# Patient Record
Sex: Female | Born: 1960 | Race: White | Hispanic: No | Marital: Married | Smoking: Never smoker
Health system: Southern US, Community
[De-identification: ages and names within clinical notes are randomized; demographics above are authoritative.]

## PROBLEM LIST (undated history)

## (undated) HISTORY — PX: BREAST EXCISIONAL BIOPSY: SUR124

---
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
## 2005-05-14 ENCOUNTER — Ambulatory Visit: Payer: Self-pay

## 2006-01-13 ENCOUNTER — Ambulatory Visit: Payer: Self-pay

## 2006-05-28 ENCOUNTER — Ambulatory Visit: Payer: Self-pay

## 2007-07-23 ENCOUNTER — Ambulatory Visit: Payer: Self-pay

## 2008-07-26 ENCOUNTER — Ambulatory Visit: Payer: Self-pay | Admitting: Unknown Physician Specialty

## 2009-04-20 ENCOUNTER — Ambulatory Visit: Payer: Self-pay

## 2009-08-08 ENCOUNTER — Ambulatory Visit: Payer: Self-pay | Admitting: Unknown Physician Specialty

## 2010-08-16 ENCOUNTER — Ambulatory Visit: Payer: Self-pay | Admitting: Unknown Physician Specialty

## 2010-08-23 ENCOUNTER — Ambulatory Visit: Payer: Self-pay | Admitting: Unknown Physician Specialty

## 2011-02-20 ENCOUNTER — Ambulatory Visit: Payer: Self-pay | Admitting: Unknown Physician Specialty

## 2011-03-13 DIAGNOSIS — I10 Essential (primary) hypertension: Secondary | ICD-10-CM | POA: Insufficient documentation

## 2011-09-10 ENCOUNTER — Ambulatory Visit: Payer: Self-pay

## 2015-05-22 DIAGNOSIS — Z1211 Encounter for screening for malignant neoplasm of colon: Secondary | ICD-10-CM | POA: Insufficient documentation

## 2016-10-21 ENCOUNTER — Other Ambulatory Visit: Payer: Self-pay | Admitting: Family Medicine

## 2016-10-21 DIAGNOSIS — Z1231 Encounter for screening mammogram for malignant neoplasm of breast: Secondary | ICD-10-CM

## 2016-10-23 ENCOUNTER — Ambulatory Visit
Admission: RE | Admit: 2016-10-23 | Discharge: 2016-10-23 | Disposition: A | Discharge status: Home / Self Care | Payer: BC Managed Care – PPO | Source: Ambulatory Visit | Attending: Family Medicine | Admitting: Family Medicine

## 2016-10-23 DIAGNOSIS — R928 Other abnormal and inconclusive findings on diagnostic imaging of breast: Secondary | ICD-10-CM | POA: Insufficient documentation

## 2016-10-23 DIAGNOSIS — Z1231 Encounter for screening mammogram for malignant neoplasm of breast: Secondary | ICD-10-CM | POA: Insufficient documentation

## 2016-10-23 DIAGNOSIS — N6489 Other specified disorders of breast: Secondary | ICD-10-CM | POA: Diagnosis not present

## 2016-10-25 ENCOUNTER — Other Ambulatory Visit: Payer: Self-pay | Admitting: Family Medicine

## 2016-10-25 DIAGNOSIS — R928 Other abnormal and inconclusive findings on diagnostic imaging of breast: Secondary | ICD-10-CM

## 2016-10-25 DIAGNOSIS — N6489 Other specified disorders of breast: Secondary | ICD-10-CM

## 2016-11-01 ENCOUNTER — Other Ambulatory Visit: Payer: Self-pay | Admitting: *Deleted

## 2016-11-01 ENCOUNTER — Ambulatory Visit
Admission: RE | Admit: 2016-11-01 | Discharge: 2016-11-01 | Disposition: A | Discharge status: Home / Self Care | Payer: BC Managed Care – PPO | Source: Ambulatory Visit | Attending: Family Medicine | Admitting: Family Medicine

## 2016-11-01 ENCOUNTER — Inpatient Hospital Stay
Admission: RE | Admit: 2016-11-01 | Discharge: 2016-11-01 | Disposition: A | Discharge status: Home / Self Care | Payer: Self-pay | Source: Ambulatory Visit | Attending: *Deleted | Admitting: *Deleted

## 2016-11-01 DIAGNOSIS — Z9289 Personal history of other medical treatment: Secondary | ICD-10-CM

## 2016-11-01 DIAGNOSIS — N6489 Other specified disorders of breast: Secondary | ICD-10-CM

## 2016-11-01 DIAGNOSIS — R928 Other abnormal and inconclusive findings on diagnostic imaging of breast: Secondary | ICD-10-CM

## 2016-11-04 ENCOUNTER — Other Ambulatory Visit: Payer: Self-pay | Admitting: *Deleted

## 2016-11-04 ENCOUNTER — Inpatient Hospital Stay
Admission: RE | Admit: 2016-11-04 | Discharge: 2016-11-04 | Disposition: A | Discharge status: Home / Self Care | Payer: Self-pay | Source: Ambulatory Visit | Attending: *Deleted | Admitting: *Deleted

## 2016-11-04 DIAGNOSIS — Z1231 Encounter for screening mammogram for malignant neoplasm of breast: Secondary | ICD-10-CM

## 2017-05-21 ENCOUNTER — Other Ambulatory Visit: Payer: Self-pay | Admitting: Family Medicine

## 2017-05-21 DIAGNOSIS — N6310 Unspecified lump in the right breast, unspecified quadrant: Secondary | ICD-10-CM

## 2017-06-26 ENCOUNTER — Ambulatory Visit
Admission: RE | Admit: 2017-06-26 | Discharge: 2017-06-26 | Disposition: A | Discharge status: Home / Self Care | Payer: BC Managed Care – PPO | Source: Ambulatory Visit | Attending: Family Medicine | Admitting: Family Medicine

## 2017-06-26 DIAGNOSIS — N6001 Solitary cyst of right breast: Secondary | ICD-10-CM | POA: Diagnosis not present

## 2017-06-26 DIAGNOSIS — N6310 Unspecified lump in the right breast, unspecified quadrant: Secondary | ICD-10-CM

## 2017-07-11 ENCOUNTER — Encounter: Payer: Self-pay | Admitting: Adult Health

## 2017-07-11 ENCOUNTER — Ambulatory Visit: Payer: Self-pay | Admitting: Adult Health

## 2017-07-11 ENCOUNTER — Encounter: Payer: Self-pay | Admitting: Registered Nurse

## 2017-07-11 VITALS — BP 144/88 | HR 72 | Temp 98.9°F | Resp 16 | Ht 64.0 in | Wt 165.0 lb

## 2017-07-11 DIAGNOSIS — R319 Hematuria, unspecified: Principal | ICD-10-CM

## 2017-07-11 DIAGNOSIS — N390 Urinary tract infection, site not specified: Secondary | ICD-10-CM

## 2017-07-11 LAB — POCT URINALYSIS DIPSTICK
BILIRUBIN UA: NEGATIVE
Blood, UA: NEGATIVE
Glucose, UA: NEGATIVE
Ketones, UA: NEGATIVE
Nitrite, UA: NEGATIVE
Spec Grav, UA: 1.015 (ref 1.010–1.025)
UROBILINOGEN UA: 0.2 U/dL
pH, UA: 6.5 (ref 5.0–8.0)

## 2017-07-11 MED ORDER — ACETAMINOPHEN 500 MG PO TABS: 1000.0000 mg | ORAL_TABLET | Freq: Four times a day (QID) | ORAL | 0 refills | Status: DC | PRN

## 2017-07-11 MED ORDER — IBUPROFEN 800 MG PO TABS: 800.0000 mg | ORAL_TABLET | Freq: Three times a day (TID) | ORAL | 0 refills | Status: DC

## 2017-07-11 MED ORDER — PHENAZOPYRIDINE HCL 97.5 MG PO TABS: 1.0000 | ORAL_TABLET | Freq: Three times a day (TID) | ORAL | Status: AC | PRN

## 2017-07-11 MED ORDER — AMOXICILLIN-POT CLAVULANATE 875-125 MG PO TABS: 1.0000 | ORAL_TABLET | Freq: Two times a day (BID) | ORAL | 0 refills | Status: DC

## 2017-07-11 NOTE — Progress Notes (Signed)
Subjective:    Patient ID: Regina Jimenez, female    DOB: [birth year], 57 y.o.   MRN: [id number]  56y/o caucasian female established patient here for left flank pain worse when wake up in morning stiff and after work in evening sits at desk works in [employer]ing.  Getting off floor difficulty due to pain as was putting on her leather boots today.  Taking motrin 800mg  po prn pain.  Babysat grandchildren 215lb 7718 month old and 4745lb 56 year old granddaughters.  Pain started after new years day.  Hasn't used flexeril in the past.  Has massage scheduled for tomorrow.  Heat, wore sons motorcyle kidney belt and helped some but didn't resolve.  Motrin just taking the edge off but wears off taking BID typically.  Thinks she got [facility] or from [facility] for UTI.  Has azo at home but not using typically she has low suprapubic symptoms/burning.  If she eats a lot of fruit that is when she typically gets UTI.  Stopped putting lemon in her water this week and only drinking plain water.  Denied holiday travel/restricting fluids.  Noted hand/finger swelling past couple days can't get rings on/off       Review of Systems  Constitutional: Negative for activity change, appetite change, chills, diaphoresis, fatigue, fever and unexpected weight change.  HENT: Negative for sore throat, trouble swallowing and voice change.   Eyes: Negative for photophobia and visual disturbance.  Respiratory: Negative for cough.   Cardiovascular: Negative for leg swelling.  Gastrointestinal: Positive for constipation. Negative for abdominal distention, abdominal pain, anal bleeding, blood in stool, diarrhea, nausea, rectal pain and vomiting.  Endocrine: Negative for cold intolerance and heat intolerance.  Genitourinary: Positive for flank pain. Negative for decreased urine volume, difficulty urinating, dysuria, enuresis, frequency, genital sores, hematuria, menstrual problem, pelvic pain, urgency, vaginal  bleeding, vaginal discharge and vaginal pain.  Musculoskeletal: Positive for back pain, joint swelling and myalgias. Negative for arthralgias, gait problem, neck pain and neck stiffness.  Skin: Negative for color change, pallor, rash and wound.  Allergic/Immunologic: Negative for food allergies.  Neurological: Negative for dizziness, tremors, seizures, syncope, facial asymmetry, speech difficulty, weakness, light-headedness, numbness and headaches.  Hematological: Negative for adenopathy. Does not bruise/bleed easily.  Psychiatric/Behavioral: Positive for sleep disturbance. Negative for agitation and confusion.       Objective:   Physical Exam  Constitutional: She is oriented to person, place, and time. Vital signs are normal. She appears well-developed and well-nourished. She is active and cooperative.  Non-toxic appearance. She does not have a sickly appearance. She does not appear ill. No distress.  HENT:  Head: Normocephalic and atraumatic.  Right Ear: Hearing and external ear normal.  Left Ear: Hearing and external ear normal.  Nose: Nose normal.  Mouth/Throat: Uvula is midline, oropharynx is clear and moist and mucous membranes are normal. Mucous membranes are not pale, not dry and not cyanotic. No oropharyngeal exudate, posterior oropharyngeal edema, posterior oropharyngeal erythema or tonsillar abscesses.  Eyes: Conjunctivae, EOM and lids are normal. Pupils are equal, round, and reactive to light. Right eye exhibits no discharge. Left eye exhibits no discharge. No scleral icterus.  Neck: Trachea normal, normal range of motion and phonation normal. Neck supple. No muscular tenderness present. No neck rigidity. No tracheal deviation, no edema, no erythema and normal range of motion present.  Cardiovascular: Normal rate, regular rhythm, normal heart sounds and intact distal pulses. Exam reveals no gallop and no friction rub.  No murmur heard. Pulses:      Radial pulses are 2+ on the  right side, and 2+ on the left side.  Pulmonary/Chest: Effort normal and breath sounds normal. No accessory muscle usage or stridor. No respiratory distress. She has no decreased breath sounds. She has no wheezes. She has no rhonchi. She has no rales. She exhibits no tenderness.  Spoke full sentences without difficulty; no cough observed in exam room  Abdominal: Soft. Normal appearance. She exhibits no shifting dullness, no distension, no pulsatile liver, no fluid wave, no abdominal bruit, no ascites, no pulsatile midline mass and no mass. Bowel sounds are decreased. There is no tenderness. There is no rigidity, no rebound, no guarding, no CVA tenderness, no tenderness at McBurney's point and negative Murphy's sign. Hernia confirmed negative in the ventral area.  Dull to percussion x 4 quads; hypoactive bowel sounds x 4 quads; negative bilateral CVA tenderness  Musculoskeletal: She exhibits no edema, tenderness or deformity.       Right shoulder: Normal.       Left shoulder: Normal.       Right elbow: Normal.      Left elbow: Normal.       Right wrist: Normal.       Left wrist: Normal.       Right hip: Normal.       Left hip: Normal.       Right knee: Normal.       Left knee: Normal.       Cervical back: Normal.       Thoracic back: She exhibits decreased range of motion and pain. She exhibits no tenderness, no bony tenderness, no swelling, no edema, no deformity, no laceration, no spasm and normal pulse.       Lumbar back: Normal.       Right forearm: Normal.       Left forearm: Normal.       Right hand: Normal.       Left hand: Normal.  Able to touch mid shins left flank pain with left rotation/lateral bending/forward flexion greater than 45 degrees worst with straightening up; normal heel/toe gait; requested assist to sit up from lying position on exam table; not TTP paraspinals; full arom c-spine; denied worsening pain with t/l/c-spine extension; wearing heeled 2 inch boots leather knee  high  Lymphadenopathy:       Head (right side): No submental, no submandibular, no tonsillar, no preauricular, no posterior auricular and no occipital adenopathy present.       Head (left side): No submental, no submandibular, no tonsillar, no preauricular, no posterior auricular and no occipital adenopathy present.    She has no cervical adenopathy.       Right cervical: No superficial cervical, no deep cervical and no posterior cervical adenopathy present.      Left cervical: No superficial cervical, no deep cervical and no posterior cervical adenopathy present.  Neurological: She is alert and oriented to person, place, and time. She has normal strength. She is not disoriented. She displays no atrophy, no tremor and normal reflexes. No cranial nerve deficit or sensory deficit. She exhibits normal muscle tone. She displays no seizure activity. Coordination and gait normal. GCS eye subscore is 4. GCS verbal subscore is 5. GCS motor subscore is 6.  Reflex Scores:      Patellar reflexes are 2+ on the right side and 2+ on the left side. On/off exam table without difficulty; gait sure and steady in hallway  Skin: Skin is warm, dry and intact. No rash noted. She is not diaphoretic. No erythema. No pallor.  Psychiatric: She has a normal mood and affect. Her speech is normal and behavior is normal. Judgment and thought content normal. Cognition and memory are normal.  Nursing note and vitals reviewed.   Discussed dipstick results with patient in clinic prior to ambulatory discharge + leu.      Assessment & Plan:  A-UTI  P-patient has received augmentin previously from PA Ratcliffe on chart review medicat 2018 and 2017.  Rx for 2 week course electronic given to patient electronically.  May stop after 1 week of 875mg  po BID #28 RF0 if symptoms resolved for at least 48 hours if not complete 2 week course if worsening after 48 hours on antibiotics e.g. Gross hematuria, unable to void every 8 hours,  worsening back pain, fever, cloudy/smelly urine follow up for re-evaluation.  May have massage tomorrow.  Motrin 800mg  po TId prn pain.  Azo OTC 100-200mg  po TID x 2 days.  Discussed will discolor urine to cold and could discolor clothing if urine drips.  Hydrate to keep urine pale clear yellow/void every 4-6 hours.  Repeat urinalysis and send culture if no improvement or worsening with plan of care.  Exitcare handout on pyelonephritis/uti given to patient.  Patient denied need for work note.  Patient verbalized understanding information/instructions, agreed with plan of care and had no further questions at this time.  Take with food OTC Ibuprofen 800mg  po TID prn pain.  Home stretches. Self massage or professional prn, foam roller use or tennis/racquetball.  Heat/cryotherapy 15 minutes QID prn.  Trial thermacare 1 applied and another given to patient for use tomorrow from clinic stock.  Consider physical therapy referral if no improvement with prescribed therapy from [facility] and/or chiropractic care.  Ensure ergonomics correct desk at work avoid repetitive motions if possible/holding phone/laptop in hand use desk/stand and/or break up lifting items into smaller loads/weights.  Patient was instructed to rest, ice, and ROM exercises.  Activity as tolerated.   Follow up if symptoms persist or worsen especially if loss of bowel/bladder control, arm/leg weakness and/or saddle paresthesias. Patient verbalized agreement and understanding of treatment plan and had no further questions at this time.  P2:  In[employer].

## 2017-07-11 NOTE — Addendum Note (Signed)
Addended by: Jethro BolusGREENE, Dontavion Noxon P on: 07/11/2017 03:26 PM   Modules accepted: Orders

## 2017-07-11 NOTE — Addendum Note (Signed)
Addended by: Florina OuHARRISON, Kaoru Rezendes J on: 07/11/2017 02:54 PM   Modules accepted: Orders

## 2017-07-11 NOTE — Patient Instructions (Signed)

## 2017-07-14 ENCOUNTER — Encounter: Payer: Self-pay | Admitting: Medical

## 2017-07-14 ENCOUNTER — Ambulatory Visit: Payer: Self-pay | Admitting: Medical

## 2017-07-14 VITALS — BP 110/80 | HR 80 | Temp 98.6°F | Resp 18 | Wt 164.0 lb

## 2017-07-14 DIAGNOSIS — N390 Urinary tract infection, site not specified: Secondary | ICD-10-CM

## 2017-07-14 DIAGNOSIS — R109 Unspecified abdominal pain: Secondary | ICD-10-CM

## 2017-07-14 LAB — POCT URINALYSIS DIPSTICK
BILIRUBIN UA: NEGATIVE
GLUCOSE UA: NEGATIVE
Leukocytes, UA: NEGATIVE
Nitrite, UA: NEGATIVE
PH UA: 6 (ref 5.0–8.0)
Spec Grav, UA: 1.02 (ref 1.010–1.025)
UROBILINOGEN UA: 0.2 U/dL

## 2017-07-14 MED ORDER — CIPROFLOXACIN HCL 500 MG PO TABS: 500.0000 mg | ORAL_TABLET | Freq: Two times a day (BID) | ORAL | 0 refills | Status: DC

## 2017-07-14 NOTE — Patient Instructions (Signed)
pyCiprofloxacin tablets What is this medicine? CIPROFLOXACIN (sip roe FLOX a sin) is a quinolone antibiotic. It is used to treat certain kinds of bacterial infections. It will not work for colds, flu, or other viral infections. This medicine may be used for other purposes; ask your health care provider or pharmacist if you have questions. COMMON BRAND NAME(S): Cipro What should I tell my health care provider before I take this medicine? They need to know if you have any of these conditions: -bone problems -history of low levels of potassium in the blood -joint problems -irregular heartbeat -kidney disease -myasthenia gravis -seizures -tendon problems -tingling of the fingers or toes, or other nerve disorder -an unusual or allergic reaction to ciprofloxacin, other antibiotics or medicines, foods, dyes, or preservatives -pregnant or trying to get pregnant -breast-feeding How should I use this medicine? Take this medicine by mouth with a glass of water. Follow the directions on the prescription label. Take your medicine at regular intervals. Do not take your medicine more often than directed. Take all of your medicine as directed even if you think your are better. Do not skip doses or stop your medicine early. You can take this medicine with food or on an empty stomach. It can be taken with a meal that contains dairy or calcium, but do not take it alone with a dairy product, like milk or yogurt or calcium-fortified juice. A special MedGuide will be given to you by the pharmacist with each prescription and refill. Be sure to read this information carefully each time. Talk to your pediatrician regarding the use of this medicine in children. Special care may be needed. Overdosage: If you think you have taken too much of this medicine contact a poison control center or emergency room at once. NOTE: This medicine is only for you. Do not share this medicine with others. What if I miss a dose? If  you miss a dose, take it as soon as you can. If it is almost time for your next dose, take only that dose. Do not take double or extra doses. What may interact with this medicine? Do not take this medicine with any of the following medications: -cisapride -dofetilide -dronedarone -flibanserin -lomitapide -pimozide -thioridazine -tizanidine -ziprasidone This medicine may also interact with the following medications: -antacids -birth control pills -caffeine -certain medicines for diabetes, like glipizide or glyburide -certain medicines that treat or prevent blood clots like warfarin -clozapine -cyclosporine -didanosine (ddI) buffered tablets or powder -duloxetine -lanthanum carbonate -lidocaine -methotrexate -multivitamins -NSAIDS, medicines for pain and inflammation, like ibuprofen or naproxen -olanzapine -omeprazole -other medicines that prolong the QT interval (cause an abnormal heart rhythm) -phenytoin -probenecid -ropinirole -sevelamer -sildenafil -sucralfate -theophylline -zolpidem This list may not describe all possible interactions. Give your health care provider a list of all the medicines, herbs, non-prescription drugs, or dietary supplements you use. Also tell them if you smoke, drink alcohol, or use illegal drugs. Some items may interact with your medicine. What should I watch for while using this medicine? Tell your doctor or health care professional if your symptoms do not improve. Do not treat diarrhea with over the counter products. Contact your doctor if you have diarrhea that lasts more than 2 days or if it is severe and watery. You may get drowsy or dizzy. Do not drive, use machinery, or do anything that needs mental alertness until you know how this medicine affects you. Do not stand or sit up quickly, especially if you are an older patient. This reduces  the risk of dizzy or fainting spells. This medicine can make you more sensitive to the sun. Keep out of  the sun. If you cannot avoid being in the sun, wear protective clothing and use sunscreen. Do not use sun lamps or tanning beds/booths. Avoid antacids, aluminum, calcium, iron, magnesium, and zinc products for 6 hours before and 2 hours after taking a dose of this medicine. What side effects may I notice from receiving this medicine? Side effects that you should report to your doctor or health care professional as soon as possible: -allergic reactions like skin rash or hives, swelling of the face, lips, or tongue -anxious -confusion -depressed mood -diarrhea -fast, irregular heartbeat -hallucination, loss of contact with reality -joint, muscle, or tendon pain or swelling -pain, tingling, numbness in the hands or feet -suicidal thoughts or other mood changes -sunburn -unusually weak or tired Side effects that usually do not require medical attention (report to your doctor or health care professional if they continue or are bothersome): -dry mouth -headache -nausea -trouble sleeping This list may not describe all possible side effects. Call your doctor for medical advice about side effects. You may report side effects to FDA at 1-800-FDA-1088. Where should I keep my medicine? Keep out of the reach of children. Store at room temperature below 30 degrees C (86 degrees F). Keep container tightly closed. Throw away any unused medicine after the expiration date. NOTE: This sheet is a summary. It may not cover all possible information. If you have questions about this medicine, talk to your doctor, pharmacist, or health care provider.  2018 Elsevier/Gold Standard (2016-02-02 14:42:02)  Pyelonephritis, Adult Pyelonephritis is a kidney infection. The kidneys are organs that help clean your blood by moving waste out of your blood and into your pee (urine). This infection can happen quickly, or it can last for a long time. In most cases, it clears up with treatment and does not cause other  problems. Follow these instructions at home: Medicines  Take over-the-counter and prescription medicines only as told by your doctor.  Take your antibiotic medicine as told by your doctor. Do not stop taking the medicine even if you start to feel better. General instructions  Drink enough fluid to keep your pee clear or pale yellow.  Avoid caffeine, tea, and carbonated drinks.  Pee (urinate) often. Avoid holding in pee for long periods of time.  Pee before and after sex.  After pooping (having a bowel movement), women should wipe from front to back. Use each tissue only once.  Keep all follow-up visits as told by your doctor. This is important. Contact a doctor if:  You do not feel better after 2 days.  Your symptoms get worse.  You have a fever. Get help right away if:  You cannot take your medicine or drink fluids as told.  You have chills and shaking.  You throw up (vomit).  You have very bad pain in your side (flank) or back.  You feel very weak or you pass out (faint). This information is not intended to replace advice given to you by your health care provider. Make sure you discuss any questions you have with your health care provider. Document Released: 08/01/2004 Document Revised: 11/30/2015 Document Reviewed: 10/17/2014 Elsevier Interactive Patient Education  [employer]2018 Elsevier Inc.

## 2017-07-14 NOTE — Progress Notes (Signed)
   Subjective:    Patient ID: Regina Jimenez, female    DOB: [birth year], 57 y.o.   MRN: [id number]  HPI 57 yo female in non acute distress today, with symptoms of  feeling bad after taking  Augmentin, took at 4 pm and then 11 pm and then 8 am the next morning and then felt bad. Decreased appetite. At  5:30 pm felt worse on Saturday.  Had  Left sided back pain, today back pain feels better. Taking medication with food and now  Every 12 hours.. Feels nauseated today. . No urgency no  Frequency or burning sensation with urination , had no  symptoms previously except left sided back pain. Woke up this morning  With body aches but took 2 tylenol this morning  and now feels better. Fever and chills on Saturday only.   Patient feels that previously when on Augmentin that she had felt bad taking medication.  Review of Systems  Constitutional: Positive for chills (Satuday night) and fever (Saturday night).  HENT: Positive for postnasal drip. Negative for congestion, ear pain and sore throat (scratchy).   Eyes: Negative for discharge and itching.  Respiratory: Positive for cough (non production).   Cardiovascular: Negative for chest pain.  Gastrointestinal: Positive for nausea. Negative for abdominal pain, constipation (now resolved) and vomiting.  Endocrine: Negative for polydipsia, polyphagia and polyuria.  Genitourinary: Positive for flank pain (left side). Negative for dysuria, frequency, hematuria and urgency.  Musculoskeletal: Positive for myalgias.  Skin: Negative for rash.  Allergic/Immunologic: Positive for environmental allergies. Negative for food allergies and immunocompromised state.  Neurological: Negative for dizziness, syncope and light-headedness.  Hematological: Negative for adenopathy.  Psychiatric/Behavioral: Negative for behavioral problems, self-injury and suicidal ideas. The patient is not nervous/anxious.        Objective:   Physical Exam  Constitutional: She is oriented  to person, place, and time. She appears well-developed and well-nourished.  HENT:  Head: Normocephalic and atraumatic.  Eyes: Conjunctivae and EOM are normal. Pupils are equal, round, and reactive to light.  Neck: Normal range of motion.  Neurological: She is alert and oriented to person, place, and time.  Skin: Skin is warm and dry. No rash noted.  Psychiatric: She has a normal mood and affect. Her behavior is normal. Judgment and thought content normal.  Nursing note and vitals reviewed.   No vertebral tenderness No CVA tenderness on palpation Skin wnl. Gait and movement wnl  Urine dip trace protein , trace blood, urine medium yelllow in color.    Assessment & Plan:  Left sided flank pain possibly kidney infection, given info on Pyelonephritis Will change to Cipro and send urine off for culture.Stop Augmentin. To increase water intake. To return to clinic in 3-5 days if not improving, sooner if worsening , Patient verbalizes understanding and has no questions at discharge.

## 2017-07-16 ENCOUNTER — Encounter: Payer: Self-pay | Admitting: *Deleted

## 2017-07-16 ENCOUNTER — Telehealth: Payer: Self-pay | Admitting: *Deleted

## 2017-07-16 LAB — URINE CULTURE: Organism ID, Bacteria: NO GROWTH

## 2017-07-16 NOTE — Telephone Encounter (Signed)
Telephone message left for patient urine culture normal/negative.  Continue cipro as prescribed by PA Ratcliffe and follow up if symptoms not resolving or worsening.  Noted patient was seen by PA Ratcliffe on 07/14/2017 after I had evaluated her on 07/11/2017 and started her on augmentin due to urinary symptoms not improving as expected.

## 2017-08-12 ENCOUNTER — Ambulatory Visit: Payer: Self-pay | Admitting: Medical

## 2017-08-12 ENCOUNTER — Encounter: Payer: Self-pay | Admitting: Medical

## 2017-08-12 VITALS — BP 126/86 | HR 80 | Temp 99.4°F | Resp 18 | Ht 64.0 in | Wt 163.2 lb

## 2017-08-12 DIAGNOSIS — J069 Acute upper respiratory infection, unspecified: Secondary | ICD-10-CM

## 2017-08-12 DIAGNOSIS — J0110 Acute frontal sinusitis, unspecified: Secondary | ICD-10-CM

## 2017-08-12 MED ORDER — AZITHROMYCIN 250 MG PO TABS: ORAL_TABLET | ORAL | 0 refills | Status: AC

## 2017-08-12 NOTE — Patient Instructions (Signed)
Upper Respiratory Infection, Adult Most upper respiratory infections (URIs) are caused by a virus. A URI affects the nose, throat, and upper air passages. The most common type of URI is often called "the common cold." Follow these instructions at home:  Take medicines only as told by your doctor.  Gargle warm saltwater or take cough drops to comfort your throat as told by your doctor.  Use a warm mist humidifier or inhale steam from a shower to increase air moisture. This may make it easier to breathe.  Drink enough fluid to keep your pee (urine) clear or pale yellow.  Eat soups and other clear broths.  Have a healthy diet.  Rest as needed.  Go back to work when your fever is gone or your doctor says it is okay. ? You may need to stay home longer to avoid giving your URI to others. ? You can also wear a face mask and wash your hands often to prevent spread of the virus.  Use your inhaler more if you have asthma.  Do not use any tobacco products, including cigarettes, chewing tobacco, or electronic cigarettes. If you need help quitting, ask your doctor. Contact a doctor if:  You are getting worse, not better.  Your symptoms are not helped by medicine.  You have chills.  You are getting more short of breath.  You have brown or red mucus.  You have yellow or brown discharge from your nose.  You have pain in your face, especially when you bend forward.  You have a fever.  You have puffy (swollen) neck glands.  You have pain while swallowing.  You have white areas in the back of your throat. Get help right away if:  You have very bad or constant: ? Headache. ? Ear pain. ? Pain in your forehead, behind your eyes, and over your cheekbones (sinus pain). ? Chest pain.  You have long-lasting (chronic) lung disease and any of the following: ? Wheezing. ? Long-lasting cough. ? Coughing up blood. ? A change in your usual mucus.  You have a stiff neck.  You have  changes in your: ? Vision. ? Hearing. ? Thinking. ? Mood. This information is not intended to replace advice given to you by your health care provider. Make sure you discuss any questions you have with your health care provider. Document Released: 12/11/2007 Document Revised: 02/25/2016 Document Reviewed: 09/29/2013 Elsevier Interactive Patient Education  2018 [employer] Sinusitis, Adult Sinusitis is soreness and inflammation of your sinuses. Sinuses are hollow spaces in the bones around your face. They are located:  Around your eyes.  In the middle of your forehead.  Behind your nose.  In your cheekbones.  Your sinuses and nasal passages are lined with a stringy fluid (mucus). Mucus normally drains out of your sinuses. When your nasal tissues get inflamed or swollen, the mucus can get trapped or blocked so air cannot flow through your sinuses. This lets bacteria, viruses, and funguses grow, and that leads to infection. Follow these instructions at home: Medicines  Take, use, or apply over-the-counter and prescription medicines only as told by your doctor. These may include nasal sprays.  If you were prescribed an antibiotic medicine, take it as told by your doctor. Do not stop taking the antibiotic even if you start to feel better. Hydrate and Humidify  Drink enough water to keep your pee (urine) clear or pale yellow.  Use a cool mist humidifier to keep the humidity level in your home above   50%.  Breathe in steam for 10-15 minutes, 3-4 times a day or as told by your doctor. You can do this in the bathroom while a hot shower is running.  Try not to spend time in cool or dry air. Rest  Rest as much as possible.  Sleep with your head raised (elevated).  Make sure to get enough sleep each night. General instructions  Put a warm, moist washcloth on your face 3-4 times a day or as told by your doctor. This will help with discomfort.  Wash your hands often with soap and  water. If there is no soap and water, use hand sanitizer.  Do not smoke. Avoid being around people who are smoking (secondhand smoke).  Keep all follow-up visits as told by your doctor. This is important. Contact a doctor if:  You have a fever.  Your symptoms get worse.  Your symptoms do not get better within 10 days. Get help right away if:  You have a very bad headache.  You cannot stop throwing up (vomiting).  You have pain or swelling around your face or eyes.  You have trouble seeing.  You feel confused.  Your neck is stiff.  You have trouble breathing. This information is not intended to replace advice given to you by your health care provider. Make sure you discuss any questions you have with your health care provider. Document Released: 12/11/2007 Document Revised: 02/18/2016 Document Reviewed: 04/19/2015 Elsevier Interactive Patient Education  2018 [employer]  

## 2017-08-12 NOTE — Progress Notes (Signed)
   Subjective:    Patient ID: Regina Jimenez, female    DOB: 1961/03/25, 57 y.o.   MRN: [id number]  HPI 57 yo female in non acute distress, started  2 weeks ago with runny nose and felt did not feel well x 2 days. Then nasal congestion, runny nose discharge not sure of color. Coughing yellow. Feels like head is in a barrel, denies fever or chills, last  2 days decreased hearing and headache forehead.  Took Tylenol 500mg  x 2  At 7 am which did help. M ucinex and Robitussin last night.  Review of Systems  Constitutional: Positive for fatigue and fever. Negative for chills.  HENT: Positive for congestion, ear pain, postnasal drip, rhinorrhea, sinus pressure, sinus pain and sore throat (scratchy, worse at night.). Negative for sneezing.   Eyes: Negative for discharge and itching.  Respiratory: Positive for cough. Negative for shortness of breath and wheezing.   Cardiovascular: Negative for chest pain.  Gastrointestinal: Negative for abdominal pain.  Genitourinary: Negative for dysuria.  Musculoskeletal: Negative for back pain and myalgias.  Skin: Negative for rash.  Allergic/Immunologic: Positive for environmental allergies. Negative for food allergies.  Neurological: Positive for headaches. Negative for dizziness, syncope and light-headedness (with blowing nose).  Hematological: Negative for adenopathy.  Psychiatric/Behavioral: Negative for behavioral problems, self-injury and suicidal ideas. The patient is not nervous/anxious.        Objective:   Physical Exam  Constitutional: She is oriented to person, place, and time. She appears well-developed and well-nourished.  HENT:  Head: Normocephalic and atraumatic.  Right Ear: Hearing, external ear and ear canal normal.  Left Ear: Hearing, external ear and ear canal normal.  Nose: Mucosal edema and rhinorrhea present.  Mouth/Throat: Uvula is midline and mucous membranes are normal. Posterior oropharyngeal edema (mild) present.  Eyes:  Conjunctivae and EOM are normal. Pupils are equal, round, and reactive to light.  Neck: Normal range of motion. Neck supple.  Cardiovascular: Normal rate, regular rhythm and normal heart sounds.  Pulmonary/Chest: No respiratory distress. She has no wheezes. She has no rales.  Musculoskeletal: Normal range of motion.  Neurological: She is alert and oriented to person, place, and time.  Skin: Skin is warm and dry.  Psychiatric: She has a normal mood and affect. Her behavior is normal. Judgment and thought content normal.  Nursing note and vitals reviewed.  Cerumen obscuring both TMs.   Assessment & Plan:  Sinusitis/ Upper Respiratory infection.  OTC Flonase and Debrox otic drops take as directed.   Meds ordered this encounter  Medications  . azithromycin (ZITHROMAX) 250 MG tablet    Sig: Take  2 tablets by mouth day one then one tablet days 2-5 take with food    Dispense:  6 tablet    Refill:  0   return to the clinic in  3 -5 days if not improving. Patient verbalizes understanding without questions at discharge.

## 2017-09-30 ENCOUNTER — Other Ambulatory Visit: Payer: Self-pay | Admitting: Family Medicine

## 2017-09-30 DIAGNOSIS — N6001 Solitary cyst of right breast: Secondary | ICD-10-CM

## 2017-09-30 DIAGNOSIS — Z1231 Encounter for screening mammogram for malignant neoplasm of breast: Secondary | ICD-10-CM

## 2017-10-27 ENCOUNTER — Other Ambulatory Visit: Payer: BC Managed Care – PPO

## 2017-11-05 ENCOUNTER — Other Ambulatory Visit: Payer: BC Managed Care – PPO

## 2017-11-06 ENCOUNTER — Ambulatory Visit
Admission: RE | Admit: 2017-11-06 | Discharge: 2017-11-06 | Disposition: A | Discharge status: Home / Self Care | Payer: BC Managed Care – PPO | Source: Ambulatory Visit | Attending: Family Medicine | Admitting: Family Medicine

## 2017-11-06 DIAGNOSIS — Z1231 Encounter for screening mammogram for malignant neoplasm of breast: Secondary | ICD-10-CM

## 2017-11-06 DIAGNOSIS — N6001 Solitary cyst of right breast: Secondary | ICD-10-CM

## 2017-11-06 DIAGNOSIS — R928 Other abnormal and inconclusive findings on diagnostic imaging of breast: Secondary | ICD-10-CM | POA: Diagnosis not present

## 2018-04-28 IMAGING — MG MM DIGITAL SCREENING BILAT W/ TOMO W/ CAD
8 of 17 series · 8 of 33 positions shown · non-contrast
Comparison: Previous exam(s).

CLINICAL DATA: Screening.

EXAM:
2D DIGITAL SCREENING BILATERAL MAMMOGRAM WITH CAD AND ADJUNCT TOMO

[L MLO (1 of 2)]
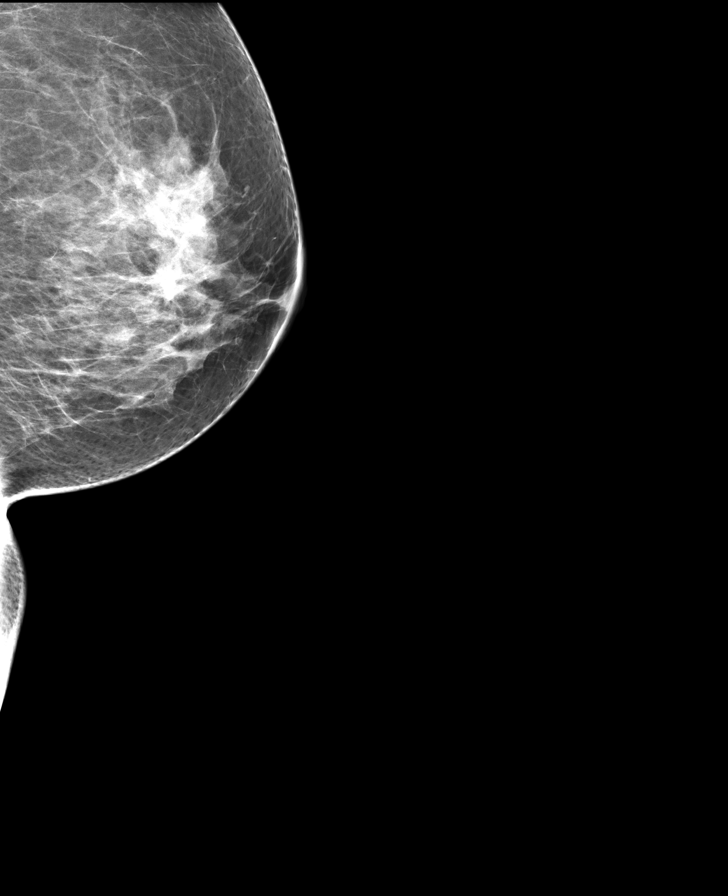

[R MLO synth-2D]
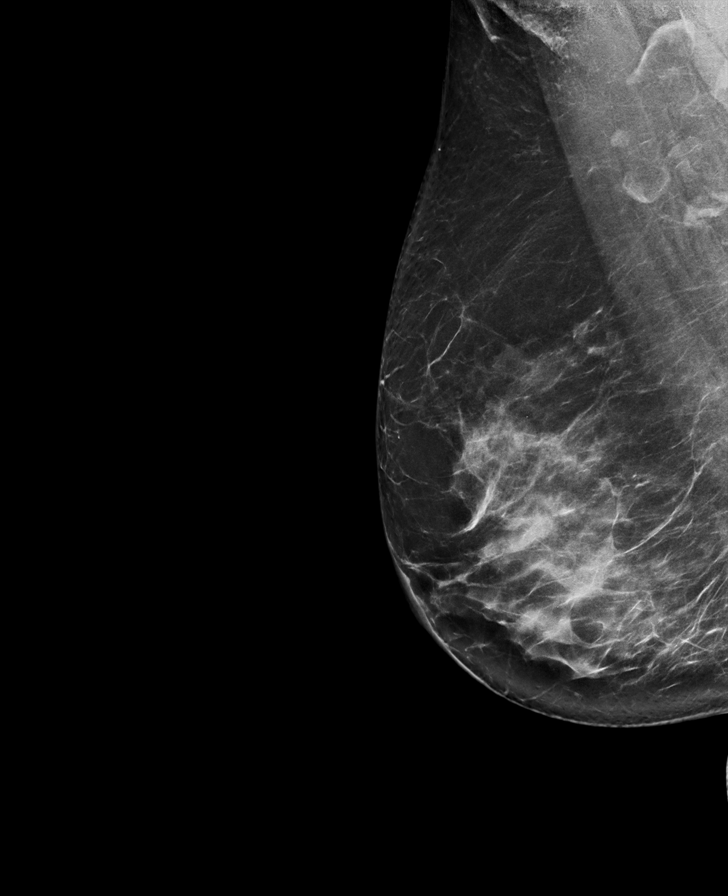

[L CC synth-2D]
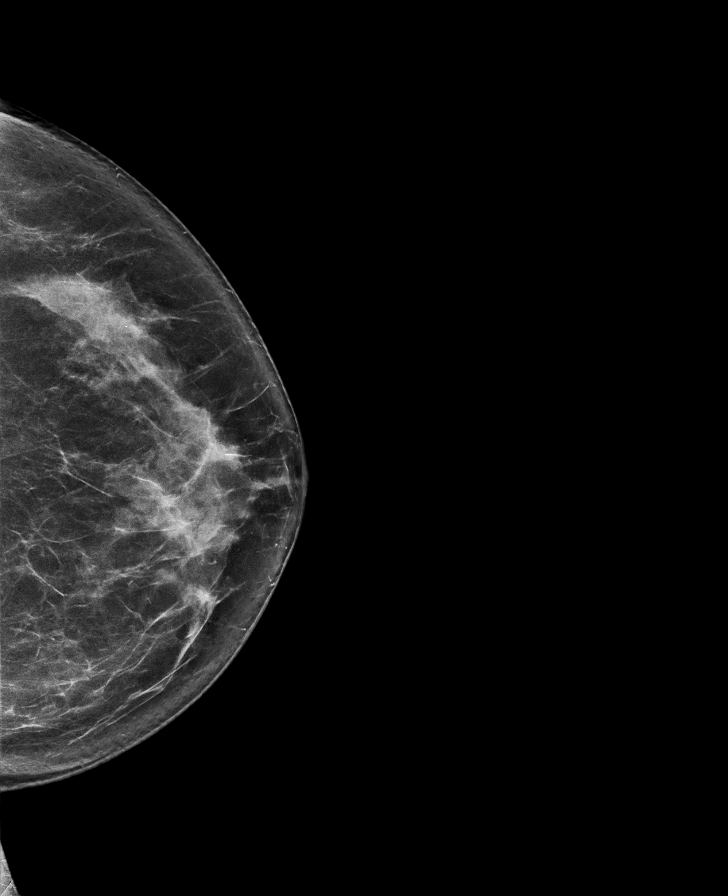

[L CC]
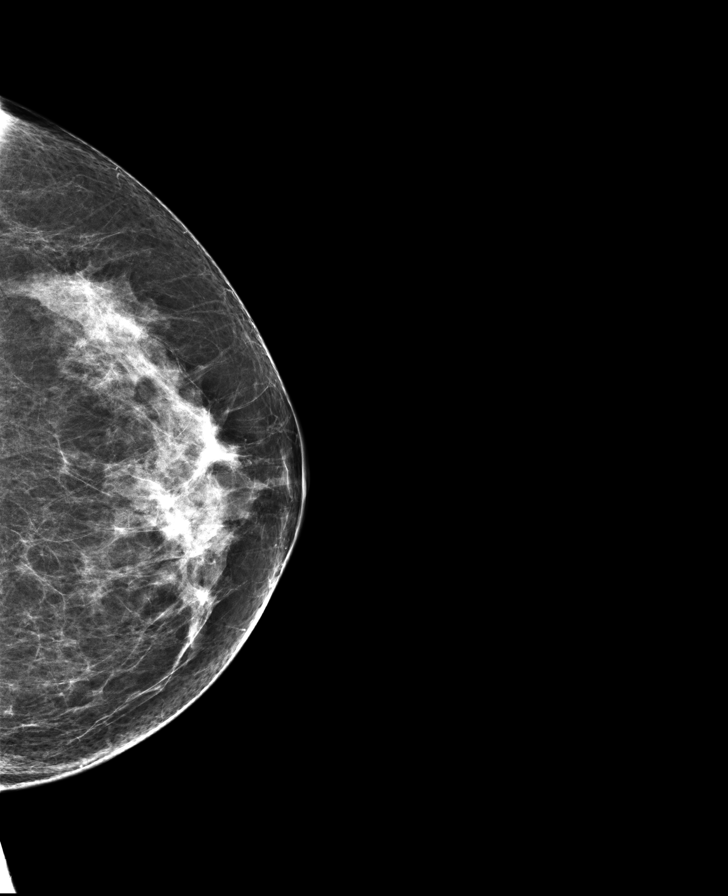

[R CC]
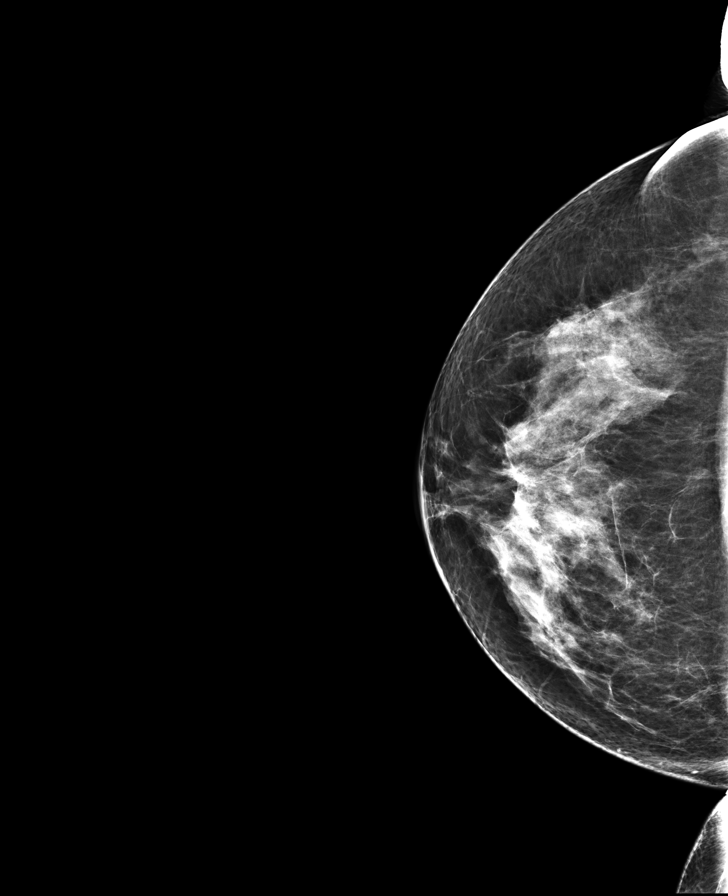

[R CC synth-2D]
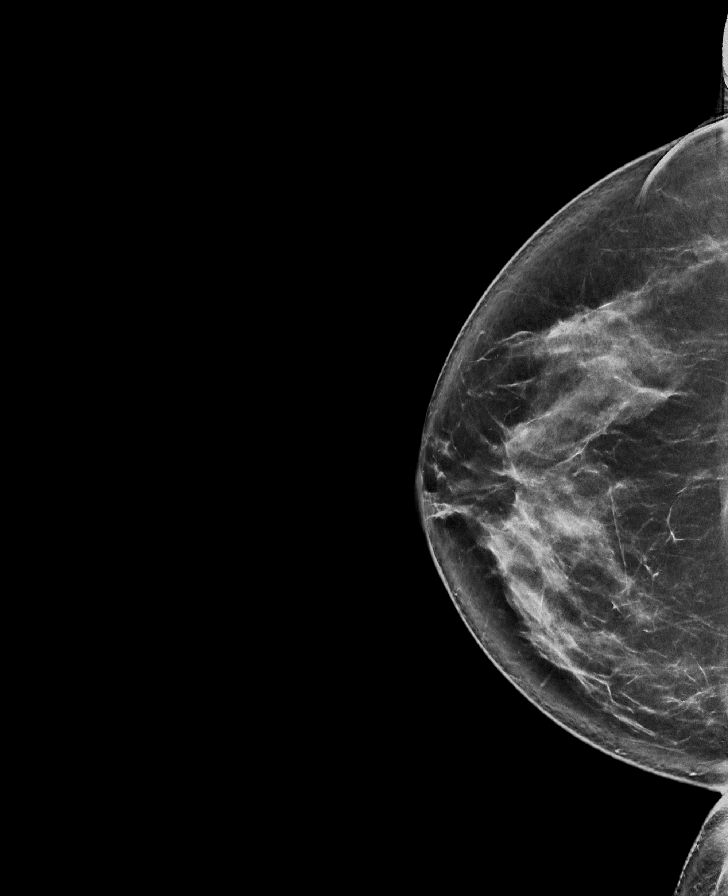

[L MLO synth-2D]
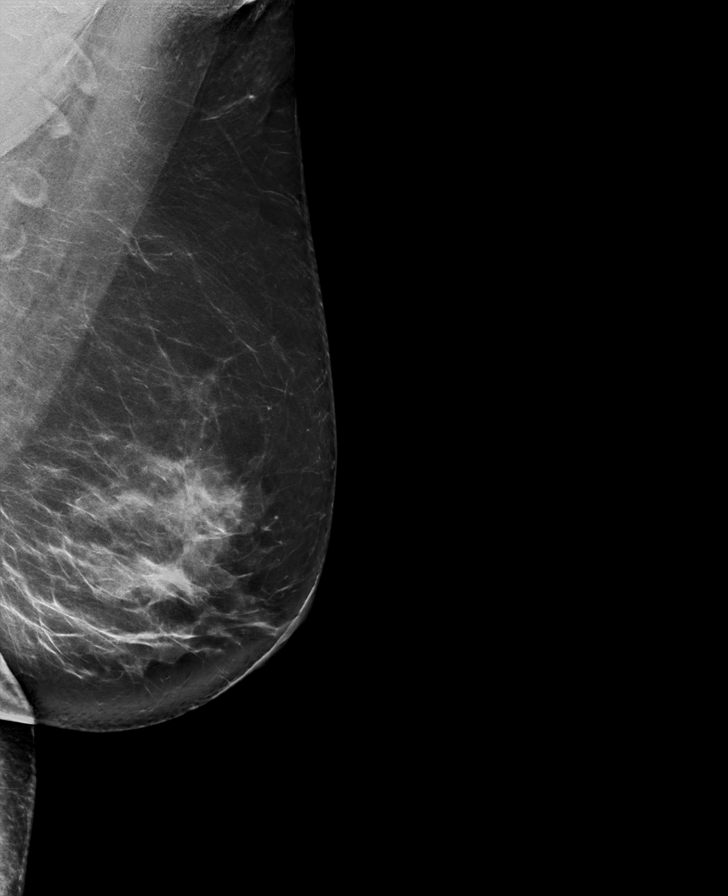

[L MLO (2 of 2)]
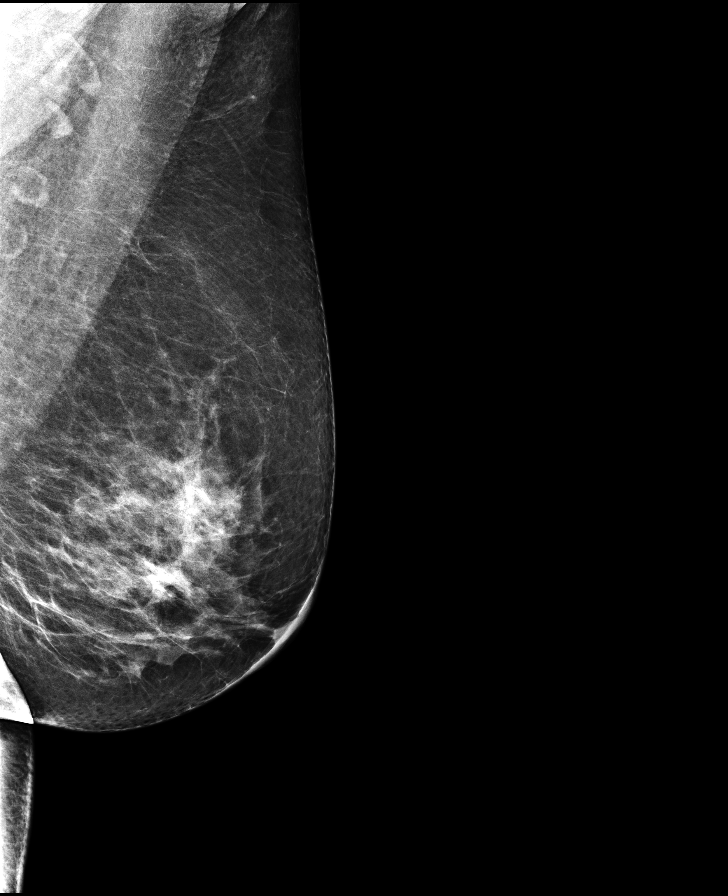

[8 of 33 positions shown; findings below may reference images not displayed]

ACR Breast Density Category c: The breast tissue is heterogeneously
dense, which may obscure small masses.
FINDINGS: In the right breast, a possible asymmetry warrants further
evaluation. In the left breast, no findings suspicious for
malignancy. Images were processed with CAD.
IMPRESSION: Further evaluation is suggested for possible asymmetry in the right
breast.

RECOMMENDATION:
Diagnostic mammogram and possibly ultrasound of the right breast.
(Code:QT-[id number])

The patient will be contacted regarding the findings, and additional
imaging will be scheduled.

BI-RADS CATEGORY  0: Incomplete. Need additional imaging evaluation
and/or prior mammograms for comparison.

## 2019-02-24 ENCOUNTER — Other Ambulatory Visit: Payer: Self-pay | Admitting: Family Medicine

## 2019-02-24 DIAGNOSIS — Z1231 Encounter for screening mammogram for malignant neoplasm of breast: Secondary | ICD-10-CM

## 2019-03-24 ENCOUNTER — Ambulatory Visit
Admission: RE | Admit: 2019-03-24 | Discharge: 2019-03-24 | Disposition: A | Discharge status: Home / Self Care | Payer: BC Managed Care – PPO | Source: Ambulatory Visit | Attending: Family Medicine | Admitting: Family Medicine

## 2019-03-24 DIAGNOSIS — Z1231 Encounter for screening mammogram for malignant neoplasm of breast: Secondary | ICD-10-CM | POA: Diagnosis not present

## 2020-02-29 ENCOUNTER — Other Ambulatory Visit: Payer: Self-pay | Admitting: Family Medicine

## 2020-02-29 DIAGNOSIS — Z1231 Encounter for screening mammogram for malignant neoplasm of breast: Secondary | ICD-10-CM

## 2020-03-28 ENCOUNTER — Other Ambulatory Visit: Payer: Self-pay

## 2020-03-28 ENCOUNTER — Ambulatory Visit
Admission: RE | Admit: 2020-03-28 | Discharge: 2020-03-28 | Disposition: A | Discharge status: Home / Self Care | Payer: BC Managed Care – PPO | Source: Ambulatory Visit | Attending: Family Medicine | Admitting: Family Medicine

## 2020-03-28 DIAGNOSIS — Z1231 Encounter for screening mammogram for malignant neoplasm of breast: Secondary | ICD-10-CM | POA: Diagnosis present

## 2021-03-06 ENCOUNTER — Other Ambulatory Visit: Payer: Self-pay | Admitting: Family Medicine

## 2021-03-06 DIAGNOSIS — Z1231 Encounter for screening mammogram for malignant neoplasm of breast: Secondary | ICD-10-CM

## 2021-03-29 ENCOUNTER — Ambulatory Visit
Admission: RE | Admit: 2021-03-29 | Discharge: 2021-03-29 | Disposition: A | Discharge status: Home / Self Care | Payer: BC Managed Care – PPO | Source: Ambulatory Visit | Attending: Family Medicine | Admitting: Family Medicine

## 2021-03-29 ENCOUNTER — Other Ambulatory Visit: Payer: Self-pay

## 2021-03-29 DIAGNOSIS — Z1231 Encounter for screening mammogram for malignant neoplasm of breast: Secondary | ICD-10-CM | POA: Diagnosis present

## 2022-03-12 ENCOUNTER — Other Ambulatory Visit: Payer: Self-pay | Admitting: Family Medicine

## 2022-03-12 DIAGNOSIS — Z1231 Encounter for screening mammogram for malignant neoplasm of breast: Secondary | ICD-10-CM

## 2022-04-15 ENCOUNTER — Ambulatory Visit
Admission: RE | Admit: 2022-04-15 | Discharge: 2022-04-15 | Disposition: A | Discharge status: Home / Self Care | Payer: BC Managed Care – PPO | Source: Ambulatory Visit | Attending: Family Medicine | Admitting: Family Medicine

## 2022-04-15 DIAGNOSIS — Z1231 Encounter for screening mammogram for malignant neoplasm of breast: Secondary | ICD-10-CM | POA: Diagnosis present

## 2022-04-18 ENCOUNTER — Other Ambulatory Visit: Payer: Self-pay | Admitting: Internal Medicine

## 2022-04-18 DIAGNOSIS — R928 Other abnormal and inconclusive findings on diagnostic imaging of breast: Secondary | ICD-10-CM

## 2022-04-18 DIAGNOSIS — N63 Unspecified lump in unspecified breast: Secondary | ICD-10-CM

## 2022-05-07 ENCOUNTER — Ambulatory Visit
Admission: RE | Admit: 2022-05-07 | Discharge: 2022-05-07 | Disposition: A | Discharge status: Home / Self Care | Payer: BC Managed Care – PPO | Source: Ambulatory Visit | Attending: Internal Medicine | Admitting: Internal Medicine

## 2022-05-07 DIAGNOSIS — N63 Unspecified lump in unspecified breast: Secondary | ICD-10-CM | POA: Diagnosis present

## 2022-05-07 DIAGNOSIS — R928 Other abnormal and inconclusive findings on diagnostic imaging of breast: Secondary | ICD-10-CM

## 2022-09-10 ENCOUNTER — Other Ambulatory Visit: Payer: Self-pay | Admitting: Otolaryngology

## 2022-09-10 DIAGNOSIS — H90A21 Sensorineural hearing loss, unilateral, right ear, with restricted hearing on the contralateral side: Secondary | ICD-10-CM

## 2022-09-26 ENCOUNTER — Ambulatory Visit
Admission: RE | Admit: 2022-09-26 | Discharge: 2022-09-26 | Disposition: A | Discharge status: Home / Self Care | Payer: BC Managed Care – PPO | Source: Ambulatory Visit | Attending: Otolaryngology | Admitting: Otolaryngology

## 2022-09-26 ENCOUNTER — Other Ambulatory Visit: Payer: BC Managed Care – PPO

## 2022-09-26 DIAGNOSIS — H90A21 Sensorineural hearing loss, unilateral, right ear, with restricted hearing on the contralateral side: Secondary | ICD-10-CM

## 2022-09-26 MED ORDER — GADOPICLENOL 0.5 MMOL/ML IV SOLN
7.5000 mL | Freq: Once | INTRAVENOUS | Status: AC | PRN
  Administered 2022-09-26: 7.5 mL via INTRAVENOUS

## 2023-05-15 ENCOUNTER — Other Ambulatory Visit: Payer: Self-pay | Admitting: Family Medicine

## 2023-05-15 DIAGNOSIS — Z1231 Encounter for screening mammogram for malignant neoplasm of breast: Secondary | ICD-10-CM

## 2023-05-20 ENCOUNTER — Ambulatory Visit
Admission: RE | Admit: 2023-05-20 | Discharge: 2023-05-20 | Disposition: A | Discharge status: Home / Self Care | Payer: BC Managed Care – PPO | Source: Ambulatory Visit | Attending: Family Medicine | Admitting: Family Medicine

## 2023-05-20 DIAGNOSIS — Z1231 Encounter for screening mammogram for malignant neoplasm of breast: Secondary | ICD-10-CM | POA: Diagnosis present

## 2024-04-22 ENCOUNTER — Other Ambulatory Visit: Payer: Self-pay | Admitting: Family Medicine

## 2024-04-22 DIAGNOSIS — Z1231 Encounter for screening mammogram for malignant neoplasm of breast: Secondary | ICD-10-CM

## 2024-05-26 ENCOUNTER — Ambulatory Visit
Admission: RE | Admit: 2024-05-26 | Discharge: 2024-05-26 | Disposition: A | Discharge status: Home / Self Care | Payer: Self-pay | Source: Ambulatory Visit | Attending: Family Medicine | Admitting: Family Medicine

## 2024-05-26 DIAGNOSIS — Z1231 Encounter for screening mammogram for malignant neoplasm of breast: Secondary | ICD-10-CM | POA: Diagnosis present
